# Patient Record
Sex: Female | Born: 2000 | Race: Black or African American | Hispanic: No | Smoking: Never smoker
Health system: Southern US, Community
[De-identification: ages and names within clinical notes are randomized; demographics above are authoritative.]

## PROBLEM LIST (undated history)

## (undated) DIAGNOSIS — F3181 Bipolar II disorder: Secondary | ICD-10-CM

## (undated) DIAGNOSIS — F329 Major depressive disorder, single episode, unspecified: Secondary | ICD-10-CM

---
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
## 2020-03-27 ENCOUNTER — Other Ambulatory Visit: Payer: Self-pay

## 2020-03-27 ENCOUNTER — Emergency Department (HOSPITAL_COMMUNITY)
Admission: EM | Admit: 2020-03-27 | Discharge: 2020-03-28 | Disposition: A | Discharge status: Other Acute Inpatient Hospital | Payer: BC Managed Care – PPO | Source: Home / Self Care | Attending: Emergency Medicine | Admitting: Emergency Medicine

## 2020-03-27 ENCOUNTER — Encounter (HOSPITAL_COMMUNITY): Payer: Self-pay | Admitting: Emergency Medicine

## 2020-03-27 DIAGNOSIS — Z7722 Contact with and (suspected) exposure to environmental tobacco smoke (acute) (chronic): Secondary | ICD-10-CM | POA: Diagnosis not present

## 2020-03-27 DIAGNOSIS — F4310 Post-traumatic stress disorder, unspecified: Secondary | ICD-10-CM | POA: Diagnosis not present

## 2020-03-27 DIAGNOSIS — R45851 Suicidal ideations: Secondary | ICD-10-CM

## 2020-03-27 DIAGNOSIS — Z818 Family history of other mental and behavioral disorders: Secondary | ICD-10-CM | POA: Diagnosis not present

## 2020-03-27 DIAGNOSIS — Z91013 Allergy to seafood: Secondary | ICD-10-CM | POA: Diagnosis not present

## 2020-03-27 DIAGNOSIS — F332 Major depressive disorder, recurrent severe without psychotic features: Secondary | ICD-10-CM | POA: Insufficient documentation

## 2020-03-27 DIAGNOSIS — Z880 Allergy status to penicillin: Secondary | ICD-10-CM | POA: Diagnosis not present

## 2020-03-27 DIAGNOSIS — Z91030 Bee allergy status: Secondary | ICD-10-CM | POA: Diagnosis not present

## 2020-03-27 DIAGNOSIS — Z79899 Other long term (current) drug therapy: Secondary | ICD-10-CM | POA: Diagnosis not present

## 2020-03-27 DIAGNOSIS — Z91018 Allergy to other foods: Secondary | ICD-10-CM | POA: Diagnosis not present

## 2020-03-27 DIAGNOSIS — Z03818 Encounter for observation for suspected exposure to other biological agents ruled out: Secondary | ICD-10-CM | POA: Diagnosis not present

## 2020-03-27 DIAGNOSIS — Z20822 Contact with and (suspected) exposure to covid-19: Secondary | ICD-10-CM | POA: Insufficient documentation

## 2020-03-27 LAB — CBC
HCT: 39.8 % (ref 36.0–46.0)
Hemoglobin: 12.8 g/dL (ref 12.0–15.0)
MCH: 27.9 pg (ref 26.0–34.0)
MCHC: 32.2 g/dL (ref 30.0–36.0)
MCV: 86.9 fL (ref 80.0–100.0)
Platelets: 394 10*3/uL (ref 150–400)
RBC: 4.58 MIL/uL (ref 3.87–5.11)
RDW: 14.6 % (ref 11.5–15.5)
WBC: 6.5 10*3/uL (ref 4.0–10.5)
nRBC: 0 % (ref 0.0–0.2)

## 2020-03-27 LAB — COMPREHENSIVE METABOLIC PANEL
ALT: 10 U/L (ref 0–44)
AST: 19 U/L (ref 15–41)
Albumin: 4.4 g/dL (ref 3.5–5.0)
Alkaline Phosphatase: 56 U/L (ref 38–126)
Anion gap: 12 (ref 5–15)
BUN: 11 mg/dL (ref 6–20)
CO2: 22 mmol/L (ref 22–32)
Calcium: 9.7 mg/dL (ref 8.9–10.3)
Chloride: 104 mmol/L (ref 98–111)
Creatinine, Ser: 1.08 mg/dL — ABNORMAL HIGH (ref 0.44–1.00)
GFR calc Af Amer: >60 mL/min (ref >60)
GFR calc non Af Amer: >60 mL/min (ref >60)
Glucose, Bld: 115 mg/dL — ABNORMAL HIGH (ref 70–99)
Potassium: 3.8 mmol/L (ref 3.5–5.1)
Sodium: 138 mmol/L (ref 135–145)
Total Bilirubin: 0.4 mg/dL (ref 0.3–1.2)
Total Protein: 7.7 g/dL (ref 6.5–8.1)

## 2020-03-27 LAB — I-STAT BETA HCG BLOOD, ED (MC, WL, AP ONLY): I-stat hCG, quantitative: <5 m[IU]/mL (ref <5)

## 2020-03-27 LAB — RAPID URINE DRUG SCREEN, HOSP PERFORMED
Amphetamines: NOT DETECTED
Barbiturates: NOT DETECTED
Benzodiazepines: NOT DETECTED
Cocaine: NOT DETECTED
Opiates: NOT DETECTED
Tetrahydrocannabinol: NOT DETECTED
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
## 2020-03-27 NOTE — ED Notes (Signed)
Pt oriented to take all her clothing out and change on scrubs. Staffing called for sitter case available at mid night. Pt sitting on a recliner chair on triage until be able to get to a room. Pt calm and cooperative at this time.
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
## 2020-03-28 ENCOUNTER — Encounter (HOSPITAL_COMMUNITY): Payer: Self-pay | Admitting: Nurse Practitioner

## 2020-03-28 ENCOUNTER — Inpatient Hospital Stay (HOSPITAL_COMMUNITY)
Admission: AD | Admit: 2020-03-28 | Discharge: 2020-03-31 | DRG: 885 | Disposition: A | Discharge status: Home / Self Care | Payer: BC Managed Care – PPO | Source: Intra-hospital | Attending: Psychiatry | Admitting: Psychiatry

## 2020-03-28 DIAGNOSIS — Z91013 Allergy to seafood: Secondary | ICD-10-CM

## 2020-03-28 DIAGNOSIS — Z880 Allergy status to penicillin: Secondary | ICD-10-CM

## 2020-03-28 DIAGNOSIS — Z20822 Contact with and (suspected) exposure to covid-19: Secondary | ICD-10-CM | POA: Diagnosis present

## 2020-03-28 DIAGNOSIS — F4310 Post-traumatic stress disorder, unspecified: Secondary | ICD-10-CM | POA: Diagnosis present

## 2020-03-28 DIAGNOSIS — R45851 Suicidal ideations: Secondary | ICD-10-CM | POA: Diagnosis present

## 2020-03-28 DIAGNOSIS — Z7722 Contact with and (suspected) exposure to environmental tobacco smoke (acute) (chronic): Secondary | ICD-10-CM | POA: Diagnosis present

## 2020-03-28 DIAGNOSIS — F332 Major depressive disorder, recurrent severe without psychotic features: Secondary | ICD-10-CM | POA: Diagnosis not present

## 2020-03-28 DIAGNOSIS — Z79899 Other long term (current) drug therapy: Secondary | ICD-10-CM

## 2020-03-28 DIAGNOSIS — Z91030 Bee allergy status: Secondary | ICD-10-CM | POA: Diagnosis not present

## 2020-03-28 DIAGNOSIS — Z818 Family history of other mental and behavioral disorders: Secondary | ICD-10-CM

## 2020-03-28 DIAGNOSIS — Z91018 Allergy to other foods: Secondary | ICD-10-CM

## 2020-03-28 LAB — ETHANOL: Alcohol, Ethyl (B): <10 mg/dL (ref <10)

## 2020-03-28 LAB — ACETAMINOPHEN LEVEL: Acetaminophen (Tylenol), Serum: <10 ug/mL — ABNORMAL LOW (ref 10–30)

## 2020-03-28 LAB — RESPIRATORY PANEL BY RT PCR (FLU A&B, COVID)
Influenza A by PCR: NEGATIVE
Influenza B by PCR: NEGATIVE
SARS Coronavirus 2 by RT PCR: NEGATIVE

## 2020-03-28 LAB — SALICYLATE LEVEL: Salicylate Lvl: <7 mg/dL — ABNORMAL LOW (ref 7.0–30.0)

## 2020-03-28 MED ORDER — ALUM & MAG HYDROXIDE-SIMETH 200-200-20 MG/5ML PO SUSP: 30.0000 mL | ORAL | Status: DC | PRN

## 2020-03-28 MED ORDER — HYDROXYZINE HCL 25 MG PO TABS: 25.0000 mg | ORAL_TABLET | Freq: Three times a day (TID) | ORAL | Status: DC | PRN

## 2020-03-28 MED ORDER — VENLAFAXINE HCL ER 37.5 MG PO CP24
37.5000 mg | ORAL_CAPSULE | Freq: Every day | ORAL | Status: DC
  Administered 2020-03-29: 08:00:00 37.5 mg via ORAL
  Filled 2020-03-28 (×2): qty 1

## 2020-03-28 MED ORDER — ACETAMINOPHEN 325 MG PO TABS
650.0000 mg | ORAL_TABLET | Freq: Four times a day (QID) | ORAL | Status: DC | PRN
  Administered 2020-03-29: 650 mg via ORAL
  Filled 2020-03-28: qty 2

## 2020-03-28 MED ORDER — MAGNESIUM HYDROXIDE 400 MG/5ML PO SUSP: 30.0000 mL | Freq: Every day | ORAL | Status: DC | PRN

## 2020-03-28 MED ORDER — NORGESTIM-ETH ESTRAD TRIPHASIC 0.18/0.215/0.25 MG-35 MCG PO TABS
1.0000 | ORAL_TABLET | Freq: Every day | ORAL | Status: DC
  Administered 2020-03-29 – 2020-03-30 (×2): 1 via ORAL

## 2020-03-28 MED ORDER — LORAZEPAM 0.5 MG PO TABS
0.5000 mg | ORAL_TABLET | Freq: Four times a day (QID) | ORAL | Status: DC | PRN
  Administered 2020-03-28 – 2020-03-30 (×3): 0.5 mg via ORAL
  Filled 2020-03-28 (×3): qty 1

## 2020-03-28 NOTE — BH Assessment (Signed)
Received TTS consult request. Pt is currently in hallway. TTS will assess Pt when she is in a room.   Pamalee Leyden, [facility], [facility] Triage Specialist [phone]
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
## 2020-03-28 NOTE — ED Notes (Addendum)
Pt voiced understanding and agreement w/tx plan - Accepted to [facility] - Signed consent form - Copy faxed to [facility] - Copy sent to Medical Records - Original placed in envelope for [facility]. Pt gave permission for RN to speak w/her mother Allie Dimmer) and father Lil Lepage). Offered for pt to call to notify them of tx plan - states will call them later.

## 2020-03-28 NOTE — ED Notes (Addendum)
Pt spoke w/her mother on phone - advised of tx plan - Accepted to [facility] - ALL belongings - 1 labeled belongings bag and 1 valuables envelope - Safe Transport - Pt aware. Pt changing into blue paper scrubs.

## 2020-03-28 NOTE — ED Notes (Signed)
BHH after 0730   bfast ordered
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
## 2020-03-28 NOTE — Progress Notes (Signed)
   03/28/20 2158  Psych Admission Type (Psych Patients Only)  Admission Status Voluntary  Psychosocial Assessment  Patient Complaints Anxiety  Eye Contact Fair  Facial Expression Flat;Anxious  Affect Appropriate to circumstance  Speech Logical/coherent  Interaction Assertive  Motor Activity Other (Comment) (WDL)  Appearance/Hygiene Unremarkable  Behavior Characteristics Appropriate to situation  Mood Anxious;Pleasant  Thought Process  Coherency WDL  Content WDL  Delusions None reported or observed  Perception WDL  Hallucination None reported or observed  Judgment Impaired  Confusion None  Danger to Self  Current suicidal ideation? Denies  Danger to Others  Danger to Others None reported or observed

## 2020-03-28 NOTE — ED Notes (Signed)
TTS in process 

## 2020-03-28 NOTE — Progress Notes (Signed)
   03/28/20 2156  COVID-19 Daily Checkoff  Have you had a fever (temp > 37.80C/100F)  in the past 24 hours?  No  If you have had runny nose, nasal congestion, sneezing in the past 24 hours, has it worsened? No  COVID-19 EXPOSURE  Have you traveled outside the state in the past 14 days? No  Have you been in contact with someone with a confirmed diagnosis of COVID-19 or PUI in the past 14 days without wearing appropriate PPE? No  Have you been living in the same home as a person with confirmed diagnosis of COVID-19 or a PUI (household contact)? No  Have you been diagnosed with COVID-19? No

## 2020-03-28 NOTE — ED Notes (Signed)
Pt noted to be sleeping - pt declined to eat breakfast.
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
## 2020-03-28 NOTE — Progress Notes (Signed)
[facility] Group Notes:  (Nursing/MHT/Case Management/Adjunct)  Date:  03/28/2020  Time:  2030  Type of Therapy:  wrap up group  Participation Level:  Active  Participation Quality:  Appropriate, Attentive, Sharing and Supportive  Affect:  Anxious  Cognitive:  Appropriate  Insight:  Improving  Engagement in Group:  Engaged  Modes of Intervention:  Clarification, Education and Support  Summary of Progress/Problems: Positive thinking and self-care were discussed.   Marcille Buffy 03/28/2020, 9:32 PM

## 2020-03-28 NOTE — Progress Notes (Signed)
Patient ID: Krista Schmidt, female   DOB: 2000/12/25, 19 y.o.   MRN: [id number]  Pt alert and oriented during [facility] admission. Pt denies SI/HI, A/VH, and any pain. Education, support, reassurance, and encouragement provided, q15 minute safety checks initiated. Pt's belongings in locker #29. Pt verbally contracts for safety. Pt ambulating on the unit with no issues. Pt remains safe on the unit.

## 2020-03-28 NOTE — BH Assessment (Signed)
Tele Assessment Note   Patient Name: Krista Schmidt MRN: [id number] Referring Physician: Sharilyn Sites, PA-C Location of Patient: [facility], [phone] Location of Provider: Behavioral Health TTS Department  Krista Schmidt is an 19 y.o. single female who presents unaccompanied to [facility] reporting symptoms of depression including suicidal ideation. She says she came to ED because she fears she will act on suicidal thoughts. Pt denies a current plan but states she overdosed on Tylenol in a suicide attempt two years ago and never sought treatment. Pt reports she has felt increasingly depressed for the past several months. She describes her mood as "shaky" and "off" and says she has mood swings from sadness to anger. She acknowledges symptoms including crying spells, social withdrawal, loss of interest in usual pleasures, fatigue, irritability, decreased concentration, increased sleep, staying in bed, decreased appetite and feelings of guilt, worthlessness and hopelessness. Pt denies any history of intentional self-injurious behaviors. Pt denies current homicidal ideation or history of violence. Pt denies any history of auditory or visual hallucinations. Pt denies history of alcohol or other substance use.  Pt identifies several stressors. She says her best friend died in a motor vehicle accident one month ago. Pt states she and deceased friend's sister actually got quite close but they are no longer on speaking terms now either. She says due to her depressive symptoms she stopped going to college classes last semester and dropped out of school. She says she is unemployed and has been too unmotivated to find employment. Pt says she used to be an ambitious, motivated person. Pt says she lives with her mother and they have a conflictual relationship. Pt states her mother has always worked and from age 58 Pt was left at home alone and was forced to assume responsibilities. She says when Pt is unhappy her  mother reacts with anger, so she learned to keep her emotions and problems to herself. Pt says she is not close to her father. She says there is a maternal and paternal history of depression and anxiety. Pt says her parents are from [place] and there is a cultural bias against mental health treatment. She says her boyfriend and another friend, Bryson Corona, are her primary support. Pt denies legal problems. She denies access to firearms.  Pt reports she is receiving outpatient therapy and medication management through [facility]. She says she has not seen her therapist in three months. She says she is prescribed Zoloft and hydroxyzine. She says she recently stopped taking medications because Zoloft makes her feel "numb" and hydroxyzine makes her feel "tired." Pt denies any history of inpatient psychiatric treatment.  Pt does not identify anyone to contact for collateral information.  Pt is dressed in hospital scrubs, alert and oriented x4. Pt speaks in a clear tone, at moderate volume and normal pace. Motor behavior appears normal. Eye contact is good. Pt's mood is depressed and affect is congruent with mood. Thought process is coherent and relevant. There is no indication Pt is currently responding to internal stimuli or experiencing delusional thought content. Pt was pleasant and cooperative throughout assessment. She states she is willing to sign voluntarily into a psychiatric facility.    Diagnosis: F33.2 Major depressive disorder, Recurrent episode, Severe  Past Medical History: History reviewed. No pertinent past medical history.  Past Surgical History:  Procedure Laterality Date  . WISDOM TOOTH EXTRACTION  07/2017    Family History:  Family History  Problem Relation Age of Onset  . Depression Mother   .  Cancer Maternal Aunt   . Hypertension Maternal Uncle   . Asthma Maternal Grandmother   . Cancer Maternal Grandfather   . Diabetes Paternal Grandmother     Social History:   reports that she is a non-smoker but has been exposed to tobacco smoke. She has never used smokeless tobacco. She reports that she does not drink alcohol or use drugs.  Additional Social History:  Alcohol / Drug Use Pain Medications: Denies abuse Prescriptions: Denies abuse Over the Counter: Denies abuse History of alcohol / drug use?: No history of alcohol / drug abuse Longest period of sobriety (when/how long): NA  CIWA: CIWA-Ar BP: 130/84 Pulse Rate: 89 COWS:    Allergies:  Allergies  Allergen Reactions  . Banana Itching and Other (See Comments)    Throat and tongue itch   . Pollen Extract Shortness Of Breath, Itching and Cough    Close to anaphylaxis and this causes wheezing  . Honey Bee Treatment [Bee Venom] Other (See Comments)    Headaches   . Shellfish-Derived Products Diarrhea, Itching and Other (See Comments)    Throat itches and gums "burn" also  . Amoxicillin Nausea And Vomiting  . Latex Rash    Home Medications: (Not in a hospital admission)   OB/GYN Status:  No LMP recorded.  General Assessment Data Assessment unable to be completed: Yes Reason for not completing assessment: Pt in hallway Location of Assessment: [facility] ED TTS Assessment: In system Is this a Tele or Face-to-Face Assessment?: Tele Assessment Is this an Initial Assessment or a Re-assessment for this encounter?: Initial Assessment Patient Accompanied by:: N/A Language Other than English: No Living Arrangements: Other (Comment)(Lives with mother) What gender do you identify as?: Female Marital status: Single Maiden name: Canby Pregnancy Status: No Living Arrangements: Parent Can pt return to current living arrangement?: Yes Admission Status: Voluntary Is patient capable of signing voluntary admission?: Yes Referral Source: Self/Family/Friend Insurance type: [place] Living Arrangements: Parent Legal Guardian: Other:(Self) Name of Psychiatrist: [place] Name of Therapist: [place]  Education Status Is patient currently in school?: No Is the patient employed, unemployed or receiving disability?: Unemployed  Risk to self with the past 6 months Suicidal Ideation: Yes-Currently Present Has patient been a risk to self within the past 6 months prior to admission? : Yes Suicidal Intent: No Has patient had any suicidal intent within the past 6 months prior to admission? : No Is patient at risk for suicide?: Yes Suicidal Plan?: No Has patient had any suicidal plan within the past 6 months prior to admission? : No Access to Means: No What has been your use of drugs/alcohol within the last 12 months?: Pt denies Previous Attempts/Gestures: Yes How many times?: 1(Overdose on Tylenol 2 years ago) Other Self Harm Risks: None Triggers for Past Attempts: Other (Comment)("Feeling lonely") Intentional Self Injurious Behavior: None Family Suicide History: No Recent stressful life event(s): Loss (Comment), Conflict (Comment), Other (Comment)(Friend died) Persecutory voices/beliefs?: No Depression: Yes Depression Symptoms: Despondent, Tearfulness, Isolating, Fatigue, Guilt, Loss of interest in usual pleasures, Feeling worthless/self pity, Feeling angry/irritable Substance abuse history and/or treatment for substance abuse?: No Suicide prevention information given to non-admitted patients: Not applicable  Risk to Others within the past 6 months Homicidal Ideation: No Does patient have any lifetime risk of violence toward others beyond the six months prior to admission? : No Thoughts of Harm to Others: No Current Homicidal Intent: No Current Homicidal Plan: No Access to Homicidal  Means: No Identified Victim: None History of harm to others?: No Assessment of Violence: None Noted Violent Behavior Description: Pt denies Does patient have access to weapons?: No Criminal Charges Pending?: No Does patient have a court date: No Is  patient on probation?: No  Psychosis Hallucinations: None noted Delusions: None noted  Mental Status Report Appearance/Hygiene: In scrubs Eye Contact: Good Motor Activity: Freedom of movement, Unremarkable Speech: Logical/coherent Level of Consciousness: Alert Mood: Depressed Affect: Depressed Anxiety Level: Minimal Thought Processes: Coherent, Relevant Judgement: Partial Orientation: Person, Place, Time, Situation Obsessive Compulsive Thoughts/Behaviors: None  Cognitive Functioning Concentration: Normal Memory: Recent Intact, Remote Intact Is patient IDD: No Insight: Good Impulse Control: Good Appetite: Poor Have you had any weight changes? : No Change Sleep: Increased Total Hours of Sleep: 10 Vegetative Symptoms: None  ADLScreening [facility] Assessment Services) Patient's cognitive ability adequate to safely complete daily activities?: Yes Patient able to express need for assistance with ADLs?: Yes Independently performs ADLs?: Yes (appropriate for developmental age)  Prior Inpatient Therapy Prior Inpatient Therapy: No  Prior Outpatient Therapy Prior Outpatient Therapy: Yes Prior Therapy Dates: Current Prior Therapy Facilty/Provider(s): [facility] Reason for Treatment: Depression Does patient have an ACCT team?: No Does patient have Intensive In-House Services?  : No Does patient have [facility] services? : No Does patient have P4CC services?: No  ADL Screening (condition at time of admission) Patient's cognitive ability adequate to safely complete daily activities?: Yes Is the patient deaf or have difficulty hearing?: No Does the patient have difficulty seeing, even when wearing glasses/contacts?: No Does the patient have difficulty concentrating, remembering, or making decisions?: No Patient able to express need for assistance with ADLs?: Yes Does the patient have difficulty dressing or bathing?: No Independently performs ADLs?: Yes (appropriate for  developmental age) Does the patient have difficulty walking or climbing stairs?: No Weakness of Legs: None Weakness of Arms/Hands: None  Home Assistive Devices/Equipment Home Assistive Devices/Equipment: None    Abuse/Neglect Assessment (Assessment to be complete while patient is alone) Abuse/Neglect Assessment Can Be Completed: Yes Physical Abuse: Denies Verbal Abuse: Denies Sexual Abuse: Denies Exploitation of patient/patient's resources: Denies Self-Neglect: Denies     Merchant navy officer (For Healthcare) Does Patient Have a Medical Advance Directive?: No Would patient like information on creating a medical advance directive?: No - Patient declined          Disposition: Gave clinical report to Nira Conn, FNP who determined Pt meets criteria for inpatient psychiatric treatment. Leeann Must at [facility], confirmed bed availability after 0730. Pt accepted to the service of Dr. Carmon Ginsberg. [place], room 306-1. Notified Sharilyn Sites, PA-C and Simeon Craft, RN of recommendation.   Disposition Initial Assessment Completed for this Encounter: Yes  This service was provided via telemedicine using a 2-way, interactive audio and video technology.  Names of all persons participating in this telemedicine service and their role in this encounter. Name: Jerilynn Birkenhead Role: Patient  Name: Shela Commons, [facility] Role: TTS counselor         Harlin Rain Patsy [place], [facility], [facility] Triage Specialist [phone]  Pamalee Leyden 03/28/2020 2:53 AM

## 2020-03-29 LAB — HEMOGLOBIN A1C
Hgb A1c MFr Bld: 5.8 % — ABNORMAL HIGH (ref 4.8–5.6)
Mean Plasma Glucose: 119.76 mg/dL

## 2020-03-29 LAB — LIPID PANEL
Cholesterol: 274 mg/dL — ABNORMAL HIGH (ref 0–169)
HDL: 99 mg/dL (ref >40)
LDL Cholesterol: 157 mg/dL — ABNORMAL HIGH (ref 0–99)
Total CHOL/HDL Ratio: 2.8 RATIO
Triglycerides: 92 mg/dL (ref <150)
VLDL: 18 mg/dL (ref 0–40)

## 2020-03-29 LAB — TSH: TSH: 2.18 u[IU]/mL (ref 0.350–4.500)

## 2020-03-29 MED ORDER — VENLAFAXINE HCL ER 75 MG PO CP24
75.0000 mg | ORAL_CAPSULE | Freq: Every day | ORAL | Status: DC
  Administered 2020-03-30 – 2020-03-31 (×2): 75 mg via ORAL
  Filled 2020-03-29 (×4): qty 1

## 2020-03-29 NOTE — BHH Group Notes (Signed)
LCSW Group Therapy Note 03/29/2020 2:13 PM  Type of Therapy and Topic: Group Therapy: Overcoming Obstacles  Participation Level: Active  Description of Group:  In this group patients will be encouraged to explore what they see as obstacles to their own wellness and recovery. They will be guided to discuss their thoughts, feelings, and behaviors related to these obstacles. The group will process together ways to cope with barriers, with attention given to specific choices patients can make. Each patient will be challenged to identify changes they are motivated to make in order to overcome their obstacles. This group will be process-oriented, with patients participating in exploration of their own experiences as well as giving and receiving support and challenge from other group members.  Therapeutic Goals: 1. Patient will identify personal and current obstacles as they relate to admission. 2. Patient will identify barriers that currently interfere with their wellness or overcoming obstacles.  3. Patient will identify feelings, thought process and behaviors related to these barriers. 4. Patient will identify two changes they are willing to make to overcome these obstacles:   Summary of Patient Progress  Krista Schmidt was engaged and participated throughout the group session. Krista Schmidt reports her main obstacle is her strained relationship with her mother. She reports her mother is not "as supportive" ash she needs to be. Krista Schmidt shared with the group that her mother and family members do not believe in "mental illness/health". She shared that she has expressed concerns regarding her depression and mental health issues, however her mother dismisses her feelings and states "it is all in your head" or "you just want some attention".   Krista Schmidt states due to her families' cultural background being rooted in [place] culture, she believes it will "extremely hard" to convince mother that her mental health needs  "are real and need to be taken seriously". Krista Schmidt states that she plans to have a conversation with her mother, however if her mother continues to be unsupportive then she will have to learn how to move forward without that support.     Therapeutic Modalities:  Cognitive Behavioral Therapy Solution Focused Therapy Motivational Interviewing Relapse Prevention Therapy   Alcario Drought Clinical Social Worker
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
## 2020-03-29 NOTE — Tx Team (Signed)
Interdisciplinary Treatment and Diagnostic Plan Update  03/29/2020 Time of Session: 9:30am Krista Schmidt MRN: [id number]  Principal Diagnosis: <principal problem not specified>  Secondary Diagnoses: Active Problems:   Severe recurrent major depression without psychotic features ([facility])   Current Medications:  Current Facility-Administered Medications  Medication Dose Route Frequency Provider Last Rate Last Admin  . acetaminophen (TYLENOL) tablet 650 mg  650 mg Oral Q6H PRN Lindon Romp A, NP      . alum & mag hydroxide-simeth (MAALOX/MYLANTA) 200-200-20 MG/5ML suspension 30 mL  30 mL Oral Q4H PRN Lindon Romp A, NP      . LORazepam (ATIVAN) tablet 0.5 mg  0.5 mg Oral Q6H PRN Cobos, Myer Peer, MD   0.5 mg at 03/28/20 2150  . magnesium hydroxide (MILK OF MAGNESIA) suspension 30 mL  30 mL Oral Daily PRN Rozetta Nunnery, NP      . Norgestimate-Ethinyl Estradiol Triphasic 0.18/0.215/0.25 MG-35 MCG tablet 1 tablet  1 tablet Oral Daily Rozetta Nunnery, NP      . Derrill Memo ON 03/30/2020] venlafaxine XR (EFFEXOR-XR) 24 hr capsule 75 mg  75 mg Oral Q breakfast Cobos, Myer Peer, MD       PTA Medications: Medications Prior to Admission  Medication Sig Dispense Refill Last Dose  . hydrOXYzine (ATARAX/VISTARIL) 25 MG tablet Take 25 mg by mouth daily.     . sertraline (ZOLOFT) 100 MG tablet Take 1.5 mg by mouth daily.     . TRI-SPRINTEC 0.18/0.215/0.25 MG-35 MCG tablet Take 1 tablet by mouth daily.        Patient Stressors: Educational concerns Loss of friend to death Occupational concerns Traumatic event  Patient Strengths: Average or above average intelligence Supportive family/friends  Treatment Modalities: Medication Management, Group therapy, Case management,  1 to 1 session with clinician, Psychoeducation, Recreational therapy.   Physician Treatment Plan for Primary Diagnosis: <principal problem not specified> Long Term Goal(s): Improvement in symptoms so as ready for discharge Improvement  in symptoms so as ready for discharge   Short Term Goals: Ability to identify changes in lifestyle to reduce recurrence of condition will improve Ability to verbalize feelings will improve Ability to disclose and discuss suicidal ideas Ability to demonstrate self-control will improve Ability to identify and develop effective coping behaviors will improve Ability to maintain clinical measurements within normal limits will improve Compliance with prescribed medications will improve Ability to identify changes in lifestyle to reduce recurrence of condition will improve Ability to verbalize feelings will improve Ability to disclose and discuss suicidal ideas Ability to demonstrate self-control will improve Ability to identify and develop effective coping behaviors will improve Ability to maintain clinical measurements within normal limits will improve Compliance with prescribed medications will improve  Medication Management: Evaluate patient's response, side effects, and tolerance of medication regimen.  Therapeutic Interventions: 1 to 1 sessions, Unit Group sessions and Medication administration.  Evaluation of Outcomes: Not Met  Physician Treatment Plan for Secondary Diagnosis: Active Problems:   Severe recurrent major depression without psychotic features ([place])  Long Term Goal(s): Improvement in symptoms so as ready for discharge Improvement in symptoms so as ready for discharge   Short Term Goals: Ability to identify changes in lifestyle to reduce recurrence of condition will improve Ability to verbalize feelings will improve Ability to disclose and discuss suicidal ideas Ability to demonstrate self-control will improve Ability to identify and develop effective coping behaviors will improve Ability to maintain clinical measurements within normal limits will improve Compliance with prescribed medications will improve Ability to  identify changes in lifestyle to reduce recurrence of  condition will improve Ability to verbalize feelings will improve Ability to disclose and discuss suicidal ideas Ability to demonstrate self-control will improve Ability to identify and develop effective coping behaviors will improve Ability to maintain clinical measurements within normal limits will improve Compliance with prescribed medications will improve     Medication Management: Evaluate patient's response, side effects, and tolerance of medication regimen.  Therapeutic Interventions: 1 to 1 sessions, Unit Group sessions and Medication administration.  Evaluation of Outcomes: Not Met   RN Treatment Plan for Primary Diagnosis: <principal problem not specified> Long Term Goal(s): Knowledge of disease and therapeutic regimen to maintain health will improve  Short Term Goals: Ability to verbalize feelings will improve, Ability to disclose and discuss suicidal ideas, Ability to identify and develop effective coping behaviors will improve and Compliance with prescribed medications will improve  Medication Management: RN will administer medications as ordered by provider, will assess and evaluate patient's response and provide education to patient for prescribed medication. RN will report any adverse and/or side effects to prescribing provider.  Therapeutic Interventions: 1 on 1 counseling sessions, Psychoeducation, Medication administration, Evaluate responses to treatment, Monitor vital signs and CBGs as ordered, Perform/monitor CIWA, COWS, AIMS and Fall Risk screenings as ordered, Perform wound care treatments as ordered.  Evaluation of Outcomes: Not Met   LCSW Treatment Plan for Primary Diagnosis: <principal problem not specified> Long Term Goal(s): Safe transition to appropriate next level of care at discharge, Engage patient in therapeutic group addressing interpersonal concerns.  Short Term Goals: Engage patient in aftercare planning with referrals and resources  Therapeutic  Interventions: Assess for all discharge needs, 1 to 1 time with Social worker, Explore available resources and support systems, Assess for adequacy in community support network, Educate family and significant other(s) on suicide prevention, Complete Psychosocial Assessment, Interpersonal group therapy.  Evaluation of Outcomes: Not Met   Progress in Treatment: Attending groups: No. New to unit  Participating in groups: No. Taking medication as prescribed: Yes. Toleration medication: Yes. Family/Significant other contact made: No, will contact:  the patient's mother Patient understands diagnosis: Yes. Discussing patient identified problems/goals with staff: Yes. Medical problems stabilized or resolved: Yes. Denies suicidal/homicidal ideation: Yes. Issues/concerns per patient self-inventory: No. Other:   New problem(s) identified: None   New Short Term/Long Term Goal(s):medication stabilization, elimination of SI thoughts, development of comprehensive mental wellness plan.    Patient Goals: "I just want to get my medications straight so I can go home"    Discharge Plan or Barriers: Patient recently admitted. CSW will continue to follow and assess for appropriate referrals and possible discharge planning.    Reason for Continuation of Hospitalization: Anxiety Depression Medication stabilization Other; describe Grief, Insomnia  Estimated Length of Stay: 3-5 days   Attendees: Patient: Krista Schmidt  03/29/2020 10:20 AM  Physician: Dr. Neita Garnet, MD 03/29/2020 10:20 AM  Nursing:  03/29/2020 10:20 AM  RN Care Manager: 03/29/2020 10:20 AM  Social Worker: Radonna Ricker, LCSW 03/29/2020 10:20 AM  Recreational Therapist:  03/29/2020 10:20 AM  Other:  03/29/2020 10:20 AM  Other:  03/29/2020 10:20 AM  Other: 03/29/2020 10:20 AM    Scribe for Treatment Team: Marylee Floras, [place] 03/29/2020 10:20 AM

## 2020-03-29 NOTE — Plan of Care (Signed)
Patient stayed in the dayroom with peers until bedtime. Alert and oriented. Calm and Cooperative. Attended group and had a snack. Presented to the medication window reporting that she is feeling some improvement. Denied thoughts of self harm. Had a long conversation with this Clinical research associate, discussing about her plans for the future. "I want to go to college, but I want to get myself together first.... patient was pleasant  during the conversation.

## 2020-03-29 NOTE — Progress Notes (Signed)
Recreation Therapy Notes  Date:  5.3.21 Time: 0930 Location: 300 Hall Dayroom  Group Topic: Stress Management  Goal Area(s) Addresses:  Patient will identify positive stress management techniques. Patient will identify benefits of using stress management post d/c.  Intervention: Stress Management  Activity: Guided Imagery.  LRT read a script that took patients on a journey to the beach to hear the peaceful waves.  Patients were to listen and follow along as script was read to engage in activity.    Education:  Stress Management, Discharge Planning.   Education Outcome: Acknowledges Education  Clinical Observations/Feedback: Pt did not attend activity.    Jazmine Longshore, LRT/CTRS         Aury Scollard A 03/29/2020 12:07 PM 

## 2020-03-29 NOTE — Progress Notes (Signed)
   03/29/20 1700  Psych Admission Type (Psych Patients Only)  Admission Status Voluntary  Psychosocial Assessment  Patient Complaints Anxiety  Eye Contact Fair  Facial Expression Flat;Anxious  Affect Appropriate to circumstance  Speech Logical/coherent  Interaction Assertive  Motor Activity Other (Comment) (WDL)  Appearance/Hygiene Unremarkable  Thought Process  Coherency WDL  Content WDL  Delusions None reported or observed  Perception WDL  Hallucination None reported or observed  Judgment Impaired  Confusion None  Danger to Self  Current suicidal ideation? Denies  Danger to Others  Danger to Others None reported or observed

## 2020-03-30 MED ORDER — WHITE PETROLATUM EX OINT
TOPICAL_OINTMENT | CUTANEOUS | Status: AC
  Filled 2020-03-30: qty 5

## 2020-03-30 NOTE — Progress Notes (Signed)
Recreation Therapy Notes  Animal-Assisted Activity (AAA) Program Checklist/Progress Notes Patient Eligibility Criteria Checklist & Daily Group note for Rec Tx Intervention  Date: 5.4.21 Time: 1430 Location: 300 Programmer, applications   AAA/T Program Assumption of Risk Form signed by Engineer, production or Parent Legal Guardian  YES  Patient is free of allergies or sever asthma  YES   Patient reports no fear of animals  YES   Patient reports no history of cruelty to animals  YES   Patient understands his/her participation is voluntary YES   Patient washes hands before animal contact  YES  Patient washes hands after animal contact  YES   Behavioral Response: Engaged  Education: Charity fundraiser, Appropriate Animal Interaction   Education Outcome: Acknowledges understanding/In group clarification offered/Needs additional education.   Clinical Observations/Feedback:  Pt attended and participated in group activity.     Caroll Rancher, LRT/CTRS         Caroll Rancher A 03/30/2020 3:16 PM

## 2020-03-30 NOTE — Plan of Care (Signed)
Nurse discussed anxiety, depression and coping skills with patient.  
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
## 2020-03-30 NOTE — Progress Notes (Signed)
D:  Patient's self inventory sheet, patient has fair sleep, sleep medication given.  Poor appetite, .low energy level, poor concentration.  Rated depression 6, hopeless 5, anxiety 4.  Denied withdrawals, then checked diarrhea, cramping.  Physical problems, pain, dizziness, hearing.   Neck pain, worst pain is #5  in past 24 hours.  Wants to insure that her medicine is situated so she can be discharged tomorrow.  Plans to talk to MD.  No discharge plans at this time. A:  Medications administered per MD orders.  Emotional support and encouragement given patient. R:  Denied SI and HI, contracts for safety.  Denied A/V hallucinations.  Safety maintained with 15 minute checks.

## 2020-03-30 NOTE — Progress Notes (Signed)
The patient states that what she learned today is how different drugs affect your body. Her goal for tomorrow is to get discharged.

## 2020-03-30 NOTE — Progress Notes (Signed)
   03/30/20 2300  Psych Admission Type (Psych Patients Only)  Admission Status Voluntary  Psychosocial Assessment  Patient Complaints Anxiety;Depression  Eye Contact Fair  Facial Expression Flat  Affect Appropriate to circumstance  Speech Logical/coherent  Interaction Other (Comment) (appropriate)  Motor Activity Other (Comment) (WNL)  Appearance/Hygiene Unremarkable  Behavior Characteristics Anxious  Mood Depressed;Anxious  Thought Process  Coherency WDL  Content WDL  Delusions WDL;None reported or observed  Perception WDL  Hallucination None reported or observed  Judgment Poor  Confusion None  Danger to Self  Current suicidal ideation? Denies  Danger to Others  Danger to Others None reported or observed

## 2020-03-30 NOTE — Progress Notes (Signed)
Patient slept throughout the night. Had no sign of distress.

## 2020-03-31 MED ORDER — HYDROXYZINE HCL 25 MG PO TABS: 25.0000 mg | ORAL_TABLET | Freq: Every day | ORAL | 1 refills | Status: AC

## 2020-03-31 MED ORDER — VENLAFAXINE HCL ER 75 MG PO CP24: 75.0000 mg | ORAL_CAPSULE | Freq: Every day | ORAL | 1 refills | Status: DC

## 2020-03-31 NOTE — Progress Notes (Signed)
  [facility] Adult Case Management Discharge Plan :  Will you be returning to the same living situation after discharge:  Yes,  patient is returning home with her mother At discharge, do you have transportation home?: Yes,  boyfriend is picking her up Do you have the ability to pay for your medications: Yes,  BCBS  Release of information consent forms completed and in the chart;  Patient's signature needed at discharge.  Patient to Follow up at: Follow-up Information    [facility]. Go on 04/07/2020.   Specialty: Behavioral Health Why: You are scheduled for an appointment with therapist Shaniece on 04/07/20 at 10:00 am.  This appointment will be held in person. Contact information: [address] [place] [address] [place] [address] [phone]        [facility] Follow up on 04/01/2020.   Why: You have an appointment on 04/01/20 at 2:00 pm.  This will be a Virtual appointment.  Please have your insurance information and your discharge summary available.  Contact information: [address] [place] [address] [phone]           Next level of care provider has access to [facility] Link:yes  Safety Planning and Suicide Prevention discussed: Yes,  with the patient     Has patient been referred to the Quitline?: N/A patient is not a smoker  Patient has been referred for addiction treatment: N/A  Maeola Sarah, LCSWA 03/31/2020, 11:00 AM

## 2020-03-31 NOTE — Progress Notes (Signed)
Recreation Therapy Notes  Date:  5.5.21 Time: 0930 Location: 300 Hall Group Room  Group Topic: Stress Management  Goal Area(s) Addresses:  Patient will identify positive stress management techniques. Patient will identify benefits of using stress management post d/c.  Intervention: Stress Management  Activity :  Progressive Muscle Relaxation.  LRT read a script that guided patients in tensing and relaxing each muscle group individually.  Patients were to listen and follow along as script was read to engage in activity.  Education:  Stress Management, Discharge Planning.   Education Outcome: Acknowledges Education  Clinical Observations/Feedback: Pt did not attend activity.    Tianna Baus, LRT/CTRS         Alfonsa Vaile A 03/31/2020 12:04 PM 

## 2020-03-31 NOTE — [facility] Suicide Risk Assessment (Signed)
[facility] INPATIENT:  Family/Significant Other Suicide Prevention Education  Suicide Prevention Education:  Patient Refusal for Family/Significant Other Suicide Prevention Education: The patient Krista Schmidt has refused to provide written consent for family/significant other to be provided Family/Significant Other Suicide Prevention Education during admission and/or prior to discharge.  Physician notified.  SPE completed with patient, as patient refused to consent to family contact. SPI pamphlet provided to pt and pt was encouraged to share information with support network, ask questions, and talk about any concerns relating to SPE. Patient denies access to guns/firearms and verbalized understanding of information provided. Mobile Crisis information also provided to patient.    Maeola Sarah 03/31/2020, 10:59 AM

## 2020-03-31 NOTE — Progress Notes (Signed)
Discharge Note:   Pt discharged at 12:00 left with her ride, her boyfriend. Pt given discharge instructions, including follow up outpatient mental health appointments, and discharge prescriptions sent to pharmacy, also given discharge medication education; pt verbalized understanding of all. Upon discharge, pt is calm, cooperative, pleasant, denies suicidal and homicidal ideation, denies hallucinations, denies feelings of depression and anxiety. All personal belongings returned to pt upon discharge. Pt was cooperative with the discharge process, voiced no complaints.

## 2020-03-31 NOTE — [facility] Suicide Risk Assessment (Signed)
[facility] Discharge Suicide Risk Assessment   Principal Problem: <principal problem not specified> Discharge Diagnoses: Active Problems:   Severe recurrent major depression without psychotic features (HCC)   Total Time spent with patient: 20 minutes  Musculoskeletal: Strength & Muscle Tone: within normal limits Gait & Station: normal Patient leans: N/A  Psychiatric Specialty Exam: Review of Systems  All other systems reviewed and are negative.   Blood pressure 110/75, pulse 78, temperature 98 F (36.7 C), temperature source Oral, resp. rate 18, height 5\' 4"  (1.626 m), weight 64.9 kg, SpO2 100 %.Body mass index is 24.55 kg/m.  General Appearance: Casual  Eye Contact::  Good  Speech:  Normal Rate409  Volume:  Normal  Mood:  Euthymic  Affect:  Congruent  Thought Process:  Coherent and Descriptions of Associations: Intact  Orientation:  Full (Time, Place, and Person)  Thought Content:  Logical  Suicidal Thoughts:  No  Homicidal Thoughts:  No  Memory:  Immediate;   Fair Recent;   Fair Remote;   Fair  Judgement:  Intact  Insight:  Fair  Psychomotor Activity:  Normal  Concentration:  Good  Recall:  Good  Fund of Knowledge:Good  Language: Good  Akathisia:  Negative  Handed:  Right  AIMS (if indicated):     Assets:  Communication Skills Desire for Improvement Housing Physical Health Social Support  Sleep:  Number of Hours: 5.5  Cognition: WNL  ADL's:  Intact   Mental Status Per Nursing Assessment::   On Admission:  Self-harm thoughts  Demographic Factors:  Adolescent or young adult  Loss Factors: NA  Historical Factors: Impulsivity  Risk Reduction Factors:   Living with another person, especially a relative  Continued Clinical Symptoms:  Depression:   Impulsivity  Cognitive Features That Contribute To Risk:  None    Suicide Risk:  Minimal: No identifiable suicidal ideation.  Patients presenting with no risk factors but with morbid ruminations; may be  classified as minimal risk based on the severity of the depressive symptoms  Follow-up Information    [facility] Go on 04/07/2020.   Specialty: Behavioral Health Why: You are scheduled for an appointment with therapist Shaniece on 04/07/20 at 10:00 am.  This appointment will be held in person. Contact information: [address] [place] [address] [place] [phone]        [facility] Follow up on 04/01/2020.   Why: You have an appointment on 04/01/20 at 2:00 pm.  This will be a Virtual appointment.  Please have your insurance information and your discharge summary available.  Contact information: [address] [place] [phone]           Plan Of Care/Follow-up recommendations:  Activity:  ad lib  [phone], MD 03/31/2020, 8:01 AM
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
## 2020-04-09 DIAGNOSIS — F331 Major depressive disorder, recurrent, moderate: Secondary | ICD-10-CM | POA: Diagnosis not present

## 2020-05-05 DIAGNOSIS — F331 Major depressive disorder, recurrent, moderate: Secondary | ICD-10-CM | POA: Diagnosis not present

## 2020-06-18 DIAGNOSIS — R05 Cough: Secondary | ICD-10-CM | POA: Diagnosis not present

## 2020-06-18 DIAGNOSIS — U071 COVID-19: Secondary | ICD-10-CM | POA: Diagnosis not present

## 2020-06-18 DIAGNOSIS — R0981 Nasal congestion: Secondary | ICD-10-CM | POA: Diagnosis not present

## 2020-06-18 DIAGNOSIS — R509 Fever, unspecified: Secondary | ICD-10-CM | POA: Diagnosis not present

## 2020-08-16 DIAGNOSIS — Z113 Encounter for screening for infections with a predominantly sexual mode of transmission: Secondary | ICD-10-CM | POA: Diagnosis not present

## 2020-08-16 DIAGNOSIS — Z6825 Body mass index (BMI) 25.0-25.9, adult: Secondary | ICD-10-CM | POA: Diagnosis not present

## 2020-08-16 DIAGNOSIS — Z01419 Encounter for gynecological examination (general) (routine) without abnormal findings: Secondary | ICD-10-CM | POA: Diagnosis not present

## 2020-09-16 DIAGNOSIS — A749 Chlamydial infection, unspecified: Secondary | ICD-10-CM | POA: Diagnosis not present

## 2020-09-16 DIAGNOSIS — Z113 Encounter for screening for infections with a predominantly sexual mode of transmission: Secondary | ICD-10-CM | POA: Diagnosis not present

## 2020-09-20 DIAGNOSIS — Z7182 Exercise counseling: Secondary | ICD-10-CM | POA: Diagnosis not present

## 2020-09-20 DIAGNOSIS — Z713 Dietary counseling and surveillance: Secondary | ICD-10-CM | POA: Diagnosis not present

## 2020-09-20 DIAGNOSIS — Z23 Encounter for immunization: Secondary | ICD-10-CM | POA: Diagnosis not present

## 2020-09-20 DIAGNOSIS — Z0000 Encounter for general adult medical examination without abnormal findings: Secondary | ICD-10-CM | POA: Diagnosis not present

## 2020-09-20 DIAGNOSIS — Z113 Encounter for screening for infections with a predominantly sexual mode of transmission: Secondary | ICD-10-CM | POA: Diagnosis not present

## 2020-09-20 DIAGNOSIS — Z111 Encounter for screening for respiratory tuberculosis: Secondary | ICD-10-CM | POA: Diagnosis not present

## 2020-09-20 DIAGNOSIS — Z68.41 Body mass index (BMI) pediatric, 5th percentile to less than 85th percentile for age: Secondary | ICD-10-CM | POA: Diagnosis not present

## 2020-09-30 DIAGNOSIS — Z1152 Encounter for screening for COVID-19: Secondary | ICD-10-CM | POA: Diagnosis not present

## 2021-05-05 DIAGNOSIS — N760 Acute vaginitis: Secondary | ICD-10-CM | POA: Diagnosis not present

## 2021-05-05 DIAGNOSIS — Z113 Encounter for screening for infections with a predominantly sexual mode of transmission: Secondary | ICD-10-CM | POA: Diagnosis not present

## 2021-06-22 DIAGNOSIS — Z23 Encounter for immunization: Secondary | ICD-10-CM | POA: Diagnosis not present

## 2021-08-24 ENCOUNTER — Encounter (HOSPITAL_COMMUNITY): Payer: Self-pay | Admitting: Nurse Practitioner

## 2021-08-24 DIAGNOSIS — Z01419 Encounter for gynecological examination (general) (routine) without abnormal findings: Secondary | ICD-10-CM | POA: Diagnosis not present

## 2021-08-24 DIAGNOSIS — Z113 Encounter for screening for infections with a predominantly sexual mode of transmission: Secondary | ICD-10-CM | POA: Diagnosis not present

## 2021-08-24 DIAGNOSIS — Z6825 Body mass index (BMI) 25.0-25.9, adult: Secondary | ICD-10-CM | POA: Diagnosis not present

## 2021-08-25 DIAGNOSIS — Z3202 Encounter for pregnancy test, result negative: Secondary | ICD-10-CM | POA: Diagnosis not present

## 2021-08-25 DIAGNOSIS — Z30430 Encounter for insertion of intrauterine contraceptive device: Secondary | ICD-10-CM | POA: Diagnosis not present

## 2021-10-03 DIAGNOSIS — R55 Syncope and collapse: Secondary | ICD-10-CM | POA: Diagnosis not present

## 2021-10-03 DIAGNOSIS — W1830XA Fall on same level, unspecified, initial encounter: Secondary | ICD-10-CM | POA: Diagnosis not present

## 2021-10-03 DIAGNOSIS — Y999 Unspecified external cause status: Secondary | ICD-10-CM | POA: Diagnosis not present

## 2021-10-03 DIAGNOSIS — S0990XA Unspecified injury of head, initial encounter: Secondary | ICD-10-CM | POA: Diagnosis not present

## 2021-10-03 NOTE — ED Provider Notes (Addendum)
 History   Chief Complaint  Patient presents with  . Loss of Consciousness    History provided by:  The patient Illness Severity:  Mild Onset quality:  Gradual Timing:  Constant Progression:  Resolved Chronicity:  New    20 year old female who presents after she had an episode of passing out yesterday and today.  No seizure-like activity was noted. Patient recently finished a heavy menstrual cycle.  Has been having some body aches, headaches over the past week.  No fevers or chills associated with this.  No chest pain or palpitations with this.   History reviewed. No pertinent past medical history.  History reviewed. No pertinent surgical history.  No family history on file.  Social History   Tobacco Use  . Smoking status: Never Smoker  . Smokeless tobacco: Never Used  Substance Use Topics  . Alcohol use: Not Currently    Comment: rarelu  . Drug use: Yes    Types: Marijuana    Comment: occ     Review of Systems  All other systems reviewed and are negative.   Physical Exam   ED Triage Vitals [10/03/21 2109]  BP 116/75  MAP (mmHg)   Pulse 91  Resp 16  Temp 98.6 F (37 C)  SpO2 98 %  O2 Device None (Room air)  O2 Flow Rate (L/min)   Weight      Physical Exam Vitals and nursing note reviewed.  Constitutional:      General: She is not in acute distress.    Appearance: She is well-developed. She is not diaphoretic.  HENT:     Head: Normocephalic.     Right Ear: External ear normal.     Left Ear: External ear normal.     Nose: Nose normal.  Eyes:     Conjunctiva/sclera: Conjunctivae normal.     Pupils: Pupils are equal, round, and reactive to light.  Cardiovascular:     Rate and Rhythm: Regular rhythm.  Pulmonary:     Effort: Pulmonary effort is normal. No respiratory distress.     Breath sounds: Normal breath sounds. No wheezing.  Abdominal:     General: Bowel sounds are normal. There is no distension.     Palpations: Abdomen is soft.      Tenderness: There is no abdominal tenderness. There is no guarding or rebound.  Musculoskeletal:        General: Normal range of motion.     Cervical back: Normal range of motion and neck supple.  Skin:    Coloration: Skin is not pale.     Findings: No erythema.  Neurological:     Mental Status: She is alert and oriented to person, place, and time.     Coordination: Coordination normal.     Comments: Able to ambulate in room independently with normal gait. Able to do heel to toe without losing balance. Romberg negative. Finger to nose is normal on both sides. 5/5 UE and LE bilaterally.    Does get lightheaded when I stand her up.   Psychiatric:        Behavior: Behavior normal.        Thought Content: Thought content normal.       ED Course  Procedures       [facility] ER provider interpretation of Imaging / Radiology:  No signs of acute intracranial bleed ER provider interpretation of EKG: ECG shows sinus rhythm, heart rate 83, no tachydysrhythmia. ER provider interpretation of Labs:  Labs Reviewed  UA, MICROSCOPIC IF INDICATED BY DIPSTICK - Abnormal; Notable for the following components:      Result Value    Urine Leukocyte Esterase Trace (*)    All other components within normal limits  CBC WITH AUTO DIFFERENTIAL PANEL - Abnormal; Notable for the following components:   RBC 4.09 (*)    Hemoglobin 11.4 (*)    Hematocrit 34.4 (*)    All other components within normal limits  MANUAL DIFFERENTIAL - Abnormal; Notable for the following components:   Seg Absolute 1.7 (*)    Lymph Manual Absolute 7.6 (*)    Blast Absolute 0.1 (*)    All other components within normal limits  COMPREHENSIVE METABOLIC PANEL - Normal  PREGNANCY, URINE - Normal  CBC AND DIFFERENTIAL   Narrative:    The following orders were created for panel order CBC and Differential. Procedure                               Abnormality         Status                    ---------                                -----------         ------                    CBC and Differential[[id number]]         Abnormal            Final result              Manual Differential[[id number]]          Abnormal            Final result               Please view results for these tests on the individual orders.  HOLD FOR URINE CULTURE  SPECIMEN FOR PATH REVIEW (LAB USE ONLY)  POCT GLUCOSE    CT Head Wo Contrast  Final Result       1.  No acute intracranial abnormality.  2.  Scattered frothy secretions in bilateral ethmoid air cells, which can be seen with acute sinusitis in the correct clinical setting.      Key medications administered in the ER:  Medications  lactated ringers  bolus (0 mLs Intravenous Stopped 10/03/21 2250)    Medical Decision Making:   Patient does get lightheaded when I stand her up.  After IV fluid she feels markedly better.  Her EKG is without emergency findings.  CT head was done because of the headaches and 2 episodes of syncope, CT head reassuring.   Suspect component of her syncope was related to recently finishing her cycle and also poor p.o. intake as suspect she has some sort of viral syndrome with the body aches and chills she has been having over the past few days.  She has no acute neurological deficits.  Able to stand without issues.  Of note, on the differential blast was seen, this was sent off for pathology, pathology equivocal.  Certainly no signs of blast crisis clinically, further, patient's white count overall is okay.   I emphasized to family in detail that this requires further outpatient work-up and likely repeat labs to be  done in the outpatient setting within a week.  Patient is with her mom and both understand the importance of follow-up care with primary care.  Given phone numbers as well to establish care with primary care.  Clinical Impression:  1. Traumatic injury of head, initial encounter   2. Syncope, unspecified syncope type      ED Disposition    ED Disposition  Discharge   Condition  Stable   Comment  [name] discharge to home/self care.                    Electronically signed by: Lolita Manly, MD 10/06/21 1643    Electronically signed by: Lolita Manly, MD 10/06/21 1645

## 2021-10-05 DIAGNOSIS — F322 Major depressive disorder, single episode, severe without psychotic features: Secondary | ICD-10-CM | POA: Diagnosis not present

## 2021-10-05 DIAGNOSIS — R002 Palpitations: Secondary | ICD-10-CM | POA: Diagnosis not present

## 2021-10-05 DIAGNOSIS — R590 Localized enlarged lymph nodes: Secondary | ICD-10-CM | POA: Diagnosis not present

## 2021-10-05 DIAGNOSIS — N898 Other specified noninflammatory disorders of vagina: Secondary | ICD-10-CM | POA: Diagnosis not present

## 2021-10-05 DIAGNOSIS — R55 Syncope and collapse: Secondary | ICD-10-CM | POA: Diagnosis not present

## 2021-10-06 DIAGNOSIS — R55 Syncope and collapse: Secondary | ICD-10-CM | POA: Diagnosis not present

## 2021-10-07 DIAGNOSIS — R55 Syncope and collapse: Secondary | ICD-10-CM | POA: Diagnosis not present

## 2021-10-14 DIAGNOSIS — Z30431 Encounter for routine checking of intrauterine contraceptive device: Secondary | ICD-10-CM | POA: Diagnosis not present

## 2021-10-14 DIAGNOSIS — N760 Acute vaginitis: Secondary | ICD-10-CM | POA: Diagnosis not present

## 2021-11-01 DIAGNOSIS — R55 Syncope and collapse: Secondary | ICD-10-CM | POA: Diagnosis not present

## 2021-11-04 DIAGNOSIS — R55 Syncope and collapse: Secondary | ICD-10-CM | POA: Diagnosis not present

## 2022-02-03 DIAGNOSIS — Z30432 Encounter for removal of intrauterine contraceptive device: Secondary | ICD-10-CM | POA: Diagnosis not present

## 2022-02-03 DIAGNOSIS — N760 Acute vaginitis: Secondary | ICD-10-CM | POA: Diagnosis not present

## 2022-02-03 DIAGNOSIS — Z309 Encounter for contraceptive management, unspecified: Secondary | ICD-10-CM | POA: Diagnosis not present

## 2022-08-29 DIAGNOSIS — Z01419 Encounter for gynecological examination (general) (routine) without abnormal findings: Secondary | ICD-10-CM | POA: Diagnosis not present

## 2022-08-29 DIAGNOSIS — Z6826 Body mass index (BMI) 26.0-26.9, adult: Secondary | ICD-10-CM | POA: Diagnosis not present

## 2022-08-29 DIAGNOSIS — Z124 Encounter for screening for malignant neoplasm of cervix: Secondary | ICD-10-CM | POA: Diagnosis not present

## 2022-08-29 DIAGNOSIS — Z1151 Encounter for screening for human papillomavirus (HPV): Secondary | ICD-10-CM | POA: Diagnosis not present

## 2022-08-29 DIAGNOSIS — Z113 Encounter for screening for infections with a predominantly sexual mode of transmission: Secondary | ICD-10-CM | POA: Diagnosis not present

## 2022-09-04 DIAGNOSIS — F331 Major depressive disorder, recurrent, moderate: Secondary | ICD-10-CM | POA: Diagnosis not present

## 2022-09-14 DIAGNOSIS — F331 Major depressive disorder, recurrent, moderate: Secondary | ICD-10-CM | POA: Diagnosis not present

## 2022-09-18 DIAGNOSIS — F331 Major depressive disorder, recurrent, moderate: Secondary | ICD-10-CM | POA: Diagnosis not present

## 2022-10-16 DIAGNOSIS — F331 Major depressive disorder, recurrent, moderate: Secondary | ICD-10-CM | POA: Diagnosis not present

## 2024-03-26 ENCOUNTER — Other Ambulatory Visit: Payer: Self-pay

## 2024-03-26 ENCOUNTER — Encounter (HOSPITAL_COMMUNITY): Payer: Self-pay | Admitting: Emergency Medicine

## 2024-03-26 ENCOUNTER — Emergency Department (HOSPITAL_COMMUNITY)
Admission: EM | Admit: 2024-03-26 | Discharge: 2024-03-27 | Disposition: A | Discharge status: Other Acute Inpatient Hospital | Attending: Emergency Medicine | Admitting: Emergency Medicine

## 2024-03-26 DIAGNOSIS — Z91040 Latex allergy status: Secondary | ICD-10-CM | POA: Diagnosis not present

## 2024-03-26 DIAGNOSIS — T1491XA Suicide attempt, initial encounter: Secondary | ICD-10-CM

## 2024-03-26 DIAGNOSIS — F332 Major depressive disorder, recurrent severe without psychotic features: Secondary | ICD-10-CM | POA: Diagnosis present

## 2024-03-26 DIAGNOSIS — T50902A Poisoning by unspecified drugs, medicaments and biological substances, intentional self-harm, initial encounter: Secondary | ICD-10-CM | POA: Diagnosis not present

## 2024-03-26 DIAGNOSIS — T43592A Poisoning by other antipsychotics and neuroleptics, intentional self-harm, initial encounter: Secondary | ICD-10-CM | POA: Insufficient documentation

## 2024-03-26 HISTORY — DX: Major depressive disorder, single episode, unspecified: F32.9

## 2024-03-26 HISTORY — DX: Bipolar II disorder: F31.81

## 2024-03-26 LAB — COMPREHENSIVE METABOLIC PANEL WITH GFR
ALT: 13 U/L (ref 0–44)
AST: 17 U/L (ref 15–41)
Albumin: 3.7 g/dL (ref 3.5–5.0)
Alkaline Phosphatase: 51 U/L (ref 38–126)
Anion gap: 9 (ref 5–15)
BUN: 13 mg/dL (ref 6–20)
CO2: 22 mmol/L (ref 22–32)
Calcium: 9 mg/dL (ref 8.9–10.3)
Chloride: 107 mmol/L (ref 98–111)
Creatinine, Ser: 1.09 mg/dL — ABNORMAL HIGH (ref 0.44–1.00)
GFR, Estimated: >60 mL/min (ref >60)
Glucose, Bld: 111 mg/dL — ABNORMAL HIGH (ref 70–99)
Potassium: 4 mmol/L (ref 3.5–5.1)
Sodium: 138 mmol/L (ref 135–145)
Total Bilirubin: 0.6 mg/dL (ref 0.0–1.2)
Total Protein: 7 g/dL (ref 6.5–8.1)

## 2024-03-26 LAB — CBC
HCT: 35 % — ABNORMAL LOW (ref 36.0–46.0)
Hemoglobin: 11.4 g/dL — ABNORMAL LOW (ref 12.0–15.0)
MCH: 28.7 pg (ref 26.0–34.0)
MCHC: 32.6 g/dL (ref 30.0–36.0)
MCV: 88.2 fL (ref 80.0–100.0)
Platelets: 314 10*3/uL (ref 150–400)
RBC: 3.97 MIL/uL (ref 3.87–5.11)
RDW: 14.1 % (ref 11.5–15.5)
WBC: 8.1 10*3/uL (ref 4.0–10.5)
nRBC: 0 % (ref 0.0–0.2)

## 2024-03-26 LAB — RAPID URINE DRUG SCREEN, HOSP PERFORMED
Amphetamines: NOT DETECTED
Barbiturates: NOT DETECTED
Benzodiazepines: NOT DETECTED
Cocaine: NOT DETECTED
Opiates: NOT DETECTED
Tetrahydrocannabinol: NOT DETECTED

## 2024-03-26 LAB — ACETAMINOPHEN LEVEL
Acetaminophen (Tylenol), Serum: <10 ug/mL — ABNORMAL LOW (ref 10–30)
Acetaminophen (Tylenol), Serum: <10 ug/mL — ABNORMAL LOW (ref 10–30)

## 2024-03-26 LAB — ETHANOL: Alcohol, Ethyl (B): <15 mg/dL (ref <15)

## 2024-03-26 LAB — HCG, SERUM, QUALITATIVE: Preg, Serum: NEGATIVE

## 2024-03-26 MED ORDER — LAMOTRIGINE 25 MG PO TABS: 25.0000 mg | ORAL_TABLET | Freq: Every day | ORAL | Status: DC

## 2024-03-26 MED ORDER — FLUOXETINE HCL 20 MG PO CAPS: 20.0000 mg | ORAL_CAPSULE | Freq: Every day | ORAL | Status: DC

## 2024-03-26 NOTE — ED Notes (Signed)
 Case #[id number]

## 2024-03-26 NOTE — ED Notes (Signed)
 I have explained the process of having her change into scrubs and locking up her belongings and having her wanded.

## 2024-03-26 NOTE — ED Notes (Signed)
 IVC IS COMPLETE 3 COPIES ATTACHED TO CLIPBOARD IN PURPLE ZONE ONE IN MEDICAL RECORDS ORIGINAL IN RED FOLDER

## 2024-03-26 NOTE — ED Notes (Signed)
 EXP 5/7

## 2024-03-26 NOTE — ED Notes (Signed)
 Please give update to fiance Giana howered [phone]

## 2024-03-26 NOTE — Consult Note (Addendum)
 [facility] Psychiatric Consult Initial  Patient Name: .Krista Schmidt  MRN: [id number]  DOB: 01/08/2001  Consult Order details:  Orders (From admission, onward)     Start     Ordered   03/26/24 1343  CONSULT TO CALL ACT TEAM       Ordering Provider: Denese Finn, PA-C  Provider:  (Not yet assigned)  Question:  Reason for Consult?  Answer:  Psych consult   03/26/24 1342             Mode of Visit: In person    Psychiatry Consult Evaluation  Service Date: March 26, 2024 LOS:  LOS: 0 days  Chief Complaint: Suicide attempt by overdose  Primary Psychiatric Diagnoses  Intentional overdose 2.  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 AA female with a past psychiatric unipolar major depression and bipolar 2 disorder, and pertinent medical comorbidities/history that include none, who presented this encounter by way of self after attempting suicide by overdose on prescription medication.  Patient currently is not yet medically clear, as well as is IVC'd, per EDP team.  Upon evaluation, patient presents with symptomology this encounter that is most consistent with a unipolar major depressive disorder.  Evidence of this is appreciable from the recent suicide attempt by way of attempting to overdose on hydroxyzine , as well as depressive symptomology endorsed by the patient she has been experiencing, in the context of psychosocial stressors.  Recommendation is for inpatient mental health hospitalization, for safety and stabilization of the patient.  Discussed with patient restarting previous medications of lamotrigine and Prozac that have been helpful previously in maintaining stability for the patient, to which patient verbalized she was amenable to this.  Diagnoses:  Active Hospital problems: Principal Problem:   Intentional overdose (HCC) Active Problems:   Severe recurrent major depression without psychotic features (HCC)     Plan   # Intentional overdose #MDD recurrent episode severe without psychotic features  ## Psychiatric Medication Recommendations:   - Recommend restart Lamictal 25 mg p.o. daily -Recommend restart Prozac at 20 mg p.o. daily  ## Medical Decision Making Capacity: Not specifically addressed in this encounter  ## Further Work-up: EKG, UA, UDS  ## Disposition:--Recommend inpatient mental health hospitalization under involuntary commitment  ## Behavioral / Environmental: -Strict agitation/safety precautions    ## Safety and Observation Level:  - Based on my clinical evaluation, I estimate the patient to be at low risk of self harm in the current setting. - At this time, we recommend  1:1 Observation. This decision is based on my review of the chart including patient's history and current presentation, interview of the patient, mental status examination, and consideration of suicide risk including evaluating suicidal ideation, plan, intent, suicidal or self-harm behaviors, risk factors, and protective factors. This judgment is based on our ability to directly address suicide risk, implement suicide prevention strategies, and develop a safety plan while the patient is in the clinical setting. Please contact our team if there is a concern that risk level has changed.  CSSR Risk Category:C-SSRS RISK CATEGORY: No Risk  Suicide Risk Assessment: Patient has following modifiable risk factors for suicide: untreated depression and medication noncompliance, which we are addressing by treatment recommendations/evaluations. Patient has following non-modifiable or demographic risk factors for suicide: history of suicide attempt and psychiatric hospitalization Patient has the following protective factors against suicide: Access to outpatient mental health care, Supportive family, Supportive friends, and no history of NSSIB  Thank you for this consult request. Recommendations have been communicated to  the primary team.  We will continue to follow at this time.   Volanda Gruber, NP       History of Present Illness   Krista Schmidt is a 23 y.o. AA female with a past psychiatric unipolar major depression and bipolar 2 disorder, and pertinent medical comorbidities/history that include none, who presented this encounter by way of self after attempting suicide by overdose on prescription medication.  Patient currently is medically clear, as well as IVC, per EDP team.  Patient seen today at the [facility] emergency department for face-to-face psychiatric evaluation.  Upon evaluation, patient endorses that in the context of psychosocial stressors, earlier today she attempted to overdose on previously prescribed hydroxyzine , in an attempt to end her life.  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  Patient endorses that she has a long history of struggling with depression, states that this is not the first time she has attempted suicide, states that she has attempted suicide one other time several years ago also by attempted overdose; denies any history past or present of self-injurious behavior.  Patient endorses that due to chronic struggles with depression, has been seeing a provider through [facility] for quite a while, and actually up until a month ago was stable on a regimen of low-dose lamotrigine and moderate dose of Prozac, but stopped taking these medications, in hopes of being able to join [employer].  Patient endorses that she is not in any therapy, as well as has not seen her provider since  she stopped taking her medication a month ago, but does have a history of utilizing therapy services in the past for a few months, though unfortunately had to stop due to it being expensive.  Patient endorses that due to recent psychosocial stressors, has been experiencing severe troubles with concentration, increased lethargy/fatigue, excessive sleeping, reduced appetite, inappropriate feelings of worthlessness/guilt, suicidal thoughts, anhedonia, mild psychomotor slowing, and troubles with irritability and agitations.  Patient endorses additionally she has been having troubles with anxiety, reports difficulties with being able to relax, worrying about a variety of things, and having difficulties with feeling stressed; denies having any panic attacks past or present though notably.  Patient endorses no utilization past or present of drugs or EtOH, but endorses that a couple months ago she began smoking nicotine by way of utilizing a vape, as well as expands on this, and endorses that she continues to vape to present.  Patient orientation is intact, no concerns for fluctuations in consciousness.  Patient endorses that her current mood is depressed, which is congruent to a tearful and sad affect and interpersonal style.  Patient endorses she currently does not feel suicidal, expresses regret around her suicide attempt, describing it as impulsive.  Patient endorses no homicidal ideations, denies auditory and/or visual hallucinations, and objectively, does not appear to be presenting with psychotic features.  Discussed with patient that given the events that transpired, recommendation would be for inpatient mental health hospitalization, to which patient verbalized she was amenable to this,  as well as it was discussed restarting previous medications of Lamictal and Prozac that have been beneficial for her, to which she also agreed.  Review of Systems  Constitutional:  Positive for malaise/fatigue.   Psychiatric/Behavioral:  Positive for depression. Negative for hallucinations, substance abuse and suicidal ideas. The patient is nervous/anxious. The patient does not have insomnia.   All other systems reviewed and are negative.   Psychiatric and Social History  Psychiatric History:  Information collected from Chart review/patient   Prev Dx/Sx: Bipolar 2, MDD Current Psych Provider: None currently, but recently, [facility] Meds (current): None currently, but approximately 1 month ago, was on a stable regimen of low-dose Lamictal and moderate dose Prozac Previous Med Trials: Abilify, Lamictal, Prozac, hydroxyzine ; [facility] 2021-Effexor , Zoloft trials-> both worked for a meaningful period of time before not working eventually Therapy: Reports history of therapy for a few months around 2021 mental health hospitalization, but none recently  Prior Psych Hospitalization: [facility] 2021 Prior Self Harm: Yes, attempted to overdose around 2020 Prior Violence: None  Family Psych History: mother has history of depression and anxiety, no suicides in family, no alcohol abuse in family  Family Hx suicide: None reported  Social History:  Developmental Hx: WDL Educational Hx: Academic librarian Hx: Editor, commissioning Hx: IVC Living Situation: Lives with girlfriend Spiritual Hx: None reported Access to weapons/lethal means: None reported  Substance History Alcohol: None reported Type of alcohol : None reported Last Drink : None reported Number of drinks per day : None reported History of alcohol withdrawal seizures : None reported History of DT's : None reported Tobacco: Vapes daily Illicit drugs: None reported Prescription drug abuse: None reported Rehab hx: None reported  Exam Findings  Physical Exam: As below Vital Signs:  Temp:  [98.5 F (36.9 C)-99.8 F (37.7 C)] 98.5 F (36.9 C) (04/30 1543) Pulse Rate:  [89-91] 89 (04/30 1543) Resp:  [16-20] 20 (04/30 1543) BP: (104-114)/(63-77)  104/63 (04/30 1543) SpO2:  [99 %-100 %] 99 % (04/30 1543) Weight:  [84.4 kg] 84.4 kg (04/30 1238) Blood pressure 104/63, pulse 89, temperature 98.5 F (36.9 C), temperature source Oral, resp. rate 20, height 5\' 4"  (1.626 m), weight 84.4 kg, SpO2 99%. Body mass index is 31.93 kg/m.  Physical Exam Vitals and nursing note reviewed.  Constitutional:      General: She is not in acute distress.    Appearance: She is not ill-appearing, toxic-appearing or diaphoretic.     Comments: Sad and tearful interpersonal style   Pulmonary:     Effort: Pulmonary effort is normal.  Skin:    General: Skin is warm and dry.  Neurological:     Mental Status: She is alert and oriented to person, place, and time.     Motor: No weakness, tremor or seizure activity.  Psychiatric:        Attention and Perception: Attention and perception normal. She does not perceive auditory or visual hallucinations.        Mood and Affect: Mood is depressed. Affect is tearful.        Speech: Speech normal.        Behavior: Behavior is slowed and withdrawn. Behavior is not agitated, aggressive or hyperactive. Behavior is cooperative.        Thought Content: Thought content normal. Thought content is not paranoid or delusional. Thought content does not include homicidal or suicidal ideation.        Cognition and Memory: Cognition and memory normal.  Judgment: Judgment is impulsive.   Mental Status Exam: General Appearance:  In scrubs with sad and tearful interpersonal style  Orientation:  Full (Time, Place, and Person)  Memory:   WDL  Concentration:  Concentration: Fair and Attention Span: Fair  Recall:  Fair  Attention  Fair  Eye Contact:  Fair  Speech:  Clear and Coherent and Normal Rate  Language:  Fair  Volume:  Normal  Mood: Depressed  Affect:  Tearful  Thought Process:  Coherent and Goal Directed  Thought Content:  Negative and Logical  Suicidal Thoughts:  No  Homicidal Thoughts:  No  Judgement:  Impulsive  Insight:  Lacking, Present, and Shallow  Psychomotor Activity: Very mild of psychomotor slowing  Akathisia:  No  Fund of Knowledge:  Fair      Assets:  Manufacturing systems engineer Desire for Improvement Financial Resources/Insurance Housing Intimacy Leisure Time Physical Health Resilience Social Support Talents/Skills Transportation Vocational/Educational  Cognition:  WNL  ADL's:  Intact  AIMS (if indicated):   0     Other History   These have been pulled in through the EMR, reviewed, and updated if appropriate.  Family History:  The patient's family history includes Asthma in her maternal grandmother; Cancer in her maternal aunt and maternal grandfather; Depression in her mother; Diabetes in her paternal grandmother; Hypertension in her maternal uncle.  Medical History: Past Medical History:  Diagnosis Date  . Bipolar 2 disorder (HCC)   . MDD (major depressive disorder)     Surgical History: Past Surgical History:  Procedure Laterality Date  . WISDOM TOOTH EXTRACTION  07/2017     Medications:  No current facility-administered medications for this encounter.  Current Outpatient Medications:  .  hydrOXYzine  (ATARAX /VISTARIL ) 25 MG tablet, Take 1 tablet (25 mg total) by mouth daily. (Patient taking differently: Take 25 mg by mouth daily as needed for anxiety.), Disp: 30 tablet, Rfl: 1 .  ARIPiprazole (ABILIFY) 5 MG tablet, Take 5 mg by mouth daily. (Patient not taking: Reported on 03/26/2024), Disp: , Rfl:  .  lamoTRIgine (LAMICTAL) 25 MG tablet, Take 25 mg by mouth daily. (Patient not taking: Reported on 03/26/2024), Disp: , Rfl:  .  PROZAC 40 MG capsule, Take 1 capsule every day by oral route. (Patient not taking: Reported on 03/26/2024), Disp: , Rfl:   Allergies: Allergies  Allergen Reactions  . Banana Itching and Other (See Comments)    Throat and tongue itch   . Pollen Extract Shortness Of Breath, Itching and Cough    Close to anaphylaxis and this causes  wheezing  . Honey Bee Treatment [Bee Venom] Other (See Comments)    Headaches   . Shellfish-Derived Products Diarrhea, Itching and Other (See Comments)    Throat itches and gums "burn" also  . Amoxicillin Nausea And Vomiting  . Latex Rash    Volanda Gruber, NP

## 2024-03-26 NOTE — ED Provider Notes (Signed)
 [place] EMERGENCY DEPARTMENT AT [facility] Provider Note   CSN: [id number] Arrival date & time: 03/26/24  1207     History  No chief complaint on file.   Krista Schmidt is a 23 y.o. female history of MDD, bipolar 2, suicide attempts in the past presented after an overdose at noon.  Patient states she took an unknown amount of 20 mg hydroxyzine  pills in attempt to end her life as "life is not going away."  Patient states she no longer has thoughts of hurting herself and has never had thoughts of hurting others.  Patient states she has had these thoughts of hurting self the past 2 weeks.  Patient states she is currently a little tired but otherwise denies chest pain, shortness of breath, abdominal pain, tremors, paresthesias or new onset weakness.  Home Medications Prior to Admission medications   Medication Sig Start Date End Date Taking? Authorizing Provider  hydrOXYzine  (ATARAX /VISTARIL ) 25 MG tablet Take 1 tablet (25 mg total) by mouth daily. 03/31/20   Money, Christella Coventry, FNP  TRI-SPRINTEC 0.18/0.215/0.25 MG-35 MCG tablet Take 1 tablet by mouth daily.  11/25/18   [provider]  venlafaxine  XR (EFFEXOR -XR) 75 MG 24 hr capsule Take 1 capsule (75 mg total) by mouth daily with breakfast. 04/01/20   Money, Christella Coventry, FNP      Allergies    Banana, Pollen extract, Honey bee treatment [bee venom], Shellfish-derived products, Amoxicillin, and Latex    Review of Systems   Review of Systems  Physical Exam Updated Vital Signs BP 114/77   Pulse 91   Temp 99.8 F (37.7 C) (Oral)   Resp 16   Ht 5\' 4"  (1.626 m)   Wt 84.4 kg   SpO2 100%   BMI 31.93 kg/m  Physical Exam Vitals reviewed.  Constitutional:      General: She is not in acute distress.    Comments: Tearful on exam  HENT:     Head: Normocephalic and atraumatic.  Eyes:     Extraocular Movements: Extraocular movements intact.     Conjunctiva/sclera: Conjunctivae normal.     Pupils: Pupils are equal, round,  and reactive to light.  Cardiovascular:     Rate and Rhythm: Normal rate and regular rhythm.     Pulses: Normal pulses.     Heart sounds: Normal heart sounds.     Comments: 2+ bilateral radial/dorsalis pedis pulses with regular rate Pulmonary:     Effort: Pulmonary effort is normal. No respiratory distress.     Breath sounds: Normal breath sounds.  Abdominal:     Palpations: Abdomen is soft.     Tenderness: There is no abdominal tenderness. There is no guarding or rebound.  Musculoskeletal:        General: Normal range of motion.     Cervical back: Normal range of motion and neck supple.     Comments: 5 out of 5 bilateral grip/leg extension strength  Skin:    General: Skin is warm and dry.     Capillary Refill: Capillary refill takes less than 2 seconds.  Neurological:     General: No focal deficit present.     Mental Status: She is alert and oriented to person, place, and time.     Comments: Sensation intact in all 4 limbs  Psychiatric:     Comments: Does not endorse SI or HI with me     ED Results / Procedures / Treatments   Labs (all labs ordered are listed, but  only abnormal results are displayed) Labs Reviewed  COMPREHENSIVE METABOLIC PANEL WITH GFR  ETHANOL  CBC  RAPID URINE DRUG SCREEN, HOSP PERFORMED  HCG, SERUM, QUALITATIVE    EKG None  Radiology No results found.  Procedures Procedures    Medications Ordered in ED Medications - No data to display  ED Course/ Medical Decision Making/ A&P                                 Medical Decision Making Amount and/or Complexity of Data Reviewed Labs: ordered.   [name] 23 y.o. presented today for psych evaluation. Working DDx that I considered at this time includes, but not limited to, primary psychosis, substance-induced psychosis, mood disturbance, SI/HI, arrhythmias, electrolyte abnormalities, seizures.  R/o DDx: Pending  Review of prior external notes: 09/03/2023 office visit  Unique Tests  and My Independent Interpretation:  CBC: Unremarkable CMP: Unremarkable Serum pregnancy: Negative UDS: Pending Ethanol: Negative APAP: Negative EKG: Pending  Social Determinants of Health: none  Discussion with Independent Historian: None  Discussion of Management of Tests:  Concha Deed, poison control  Risk: High: hospitalization or escalation of hospital-level care  Risk Stratification Score: none  Plan: Patient presented after overdose on hydroxyzine  pills in attempt to end her life.  Patient states she has history of this in the past.  Patient currently states that she feels sleepy but otherwise has no concerns but that she no longer has thoughts of hurting herself or others.  Given patient's attempt to end her life IVC paperwork was completed.  I did speak to Concha Deed from poison control and she recommends getting a Tylenol  level at the 4-hour mark along with getting an EKG and if the QTc is longer than 500 optimize magnesium  and potassium.  After 8 hours of observation being asymptomatic patient would be medically cleared.  Will order these and once medically cleared will consult TTS for psych eval.  Patient signed out to [place], [place].  Please review their note for the continuation of patient's care.  The plan at this point is follow-up on labs and EKG and monitor until 8:00.  If patient is still asymptomatic at this point patient is medically cleared and can be evaluated by TTS.  This chart was dictated using voice recognition software.  Despite best efforts to proofread,  errors can occur which can change the documentation meaning.        Final Clinical Impression(s) / ED Diagnoses Final diagnoses:  None    Rx / DC Orders ED Discharge Orders     None         [name] 03/26/24 1509    Roberts Ching, MD 03/27/24 [phone]

## 2024-03-26 NOTE — ED Notes (Signed)
Unable to provide urine

## 2024-03-26 NOTE — ED Provider Notes (Signed)
   Accepted handoff at shift change from [facility]. Please see prior provider note for more detail.   Briefly: Patient is 23 y.o. "presented after an overdose at noon. Patient states she took an unknown amount of 20 mg hydroxyzine  pills in attempt to end her life as "life is not going away." Patient states she no longer has thoughts of hurting herself and has never had thoughts of hurting others. Patient states she has had these thoughts of hurting self the past 2 weeks. Patient states she is currently a little tired but otherwise denies chest pain, shortness of breath, abdominal pain, tremors, paresthesias or new onset weakness."  DDX: concern for primary psychosis, substance-induced psychosis, mood disturbance, SI/HI, arrhythmias, electrolyte abnormalities, seizures.   Plan:  - Patient IVC'd - repeat tylenol  level at 4PM. Patient can be medically cleared at 8PM. - EKG sinus rhythm - no QT prolongation. - repeat tylenol  level within normal limits.  Reevaluated patient at 8 PM.  Patient stating she feels good and does not have any symptoms currently.  Patient tolerating p.o.  Patient now medically cleared. - Psych recommending inpatient management.     [place], [place] 03/26/24 2022    Roberts Ching, MD 03/27/24 [phone]

## 2024-03-26 NOTE — Progress Notes (Signed)
 LCSW Progress Note:   Krista Schmidt  MRN: [id number]  03/26/2024 11:02 pm   Per Heyward Loud, NP, we Recommend inpatient mental health hospitalization under involuntary commitment. Per Kathryn Parish, RN no beds at [facility]. Advised to fax patient out. Patient faxed out to the following facilities consideration of bed placement.  Destination  Service Provider   Address Phone Fax Patient Preferred  [facility]   [address] [place] [address] [phone] --  [facility]   [address] [place], [place] [address] [phone] --  [facility]   [address] [place] [address] [phone] --  [facility]   [address] [place] [address] [phone] --  [facility]   [address], [place] [address] [phone] --  [facility]   [address] [place] [address] [phone] --  [facility]   [address] [place], [place] [phone] --  [facility]   [address] [place], [place] [address] [phone] --  [facility]   [address] [place] [address] [phone] --

## 2024-03-26 NOTE — ED Triage Notes (Signed)
 Pt came in POV for ingestion.  PT states she took an unknown number of 20 mg hydroxyzine  pills.  Pt states she was attempting self-harm.  She states she has had SI thoughts for about 2 weeks and this is the first time she had a plan and followed through in the last 2 weeks. Pt endorses disappointing life circumstances as the reason for her SI attempt.  She was trying to go into service but was not excepted.   She has hx of MDD and BIpolar 2 and has been hospitalized for [facility] concerns before.

## 2024-03-26 NOTE — ED Notes (Signed)
 IVC PAPERWORK HAS BEEN EFILED [facility] #[id number] WAITING ON OFFICER FOR FINDINGS AND CUSTODY ORDER

## 2024-03-27 NOTE — ED Notes (Signed)
IVC CURRENT

## 2024-03-27 NOTE — Progress Notes (Signed)
 Krista Schmidt  MRN: [id number]  03/26/2024 11:45 pm   CSW team received a call from Intake Coordinator [place]. Patient has been accepted for admission to [facility] on 03/27/2024 after 9:00 AM. Accepting provider: Dr. Dionisio Frederic Nurse report to be called in to [phone]. Patient's care team provided disposition updates.

## 2024-03-27 NOTE — ED Provider Notes (Signed)
 Emergency Medicine Observation Re-evaluation Note  Krista Schmidt is a 23 y.o. female, seen on rounds today.  Pt initially presented to the ED for complaints of suicidal ideation and overdose.  Physical Exam  BP 97/76 (BP Location: Right Arm)   Pulse 80   Temp 98.4 F (36.9 C) (Oral)   Resp 17   Ht 1.626 m (5\' 4" )   Wt 84.4 kg   SpO2 98%   BMI 31.93 kg/m  Physical Exam General: Resting Cardiac: Regular rate and rhythm Lungs: Normal effort   ED Course / MDM  EKG:EKG Interpretation Date/Time:  Wednesday March 26 2024 15:11:50 EDT Ventricular Rate:  78 PR Interval:  144 QRS Duration:  76 QT Interval:  388 QTC Calculation: 442 R Axis:   66  Text Interpretation: Normal sinus rhythm Normal ECG No old tracing to compare Confirmed by Albertus Hughs ([id number]) on 03/26/2024 3:24:53 PM  I have reviewed the labs performed to date as well as medications administered while in observation.  Recent changes in the last 24 hours include evaluation by psychiatry team.  Plan  Current plan is for inpatient treatment.  Patient has been medically cleared.    Trish Furl, MD 03/27/24 [phone]

## 2024-05-31 ENCOUNTER — Emergency Department (HOSPITAL_COMMUNITY)
Admission: EM | Admit: 2024-05-31 | Discharge: 2024-06-01 | Disposition: A | Discharge status: Home / Self Care | Attending: Student | Admitting: Student

## 2024-05-31 DIAGNOSIS — F410 Panic disorder [episodic paroxysmal anxiety] without agoraphobia: Secondary | ICD-10-CM | POA: Diagnosis not present

## 2024-05-31 DIAGNOSIS — R55 Syncope and collapse: Secondary | ICD-10-CM | POA: Diagnosis present

## 2024-05-31 DIAGNOSIS — Z91040 Latex allergy status: Secondary | ICD-10-CM | POA: Insufficient documentation

## 2024-06-01 ENCOUNTER — Other Ambulatory Visit: Payer: Self-pay

## 2024-06-01 ENCOUNTER — Encounter (HOSPITAL_COMMUNITY): Payer: Self-pay | Admitting: *Deleted

## 2024-06-01 DIAGNOSIS — R55 Syncope and collapse: Secondary | ICD-10-CM | POA: Diagnosis not present

## 2024-06-01 LAB — CBC WITH DIFFERENTIAL/PLATELET
Abs Immature Granulocytes: 0.03 K/uL (ref 0.00–0.07)
Basophils Absolute: 0 K/uL (ref 0.0–0.1)
Basophils Relative: 0 %
Eosinophils Absolute: 0.5 K/uL (ref 0.0–0.5)
Eosinophils Relative: 5 %
HCT: 33.3 % — ABNORMAL LOW (ref 36.0–46.0)
Hemoglobin: 11.2 g/dL — ABNORMAL LOW (ref 12.0–15.0)
Immature Granulocytes: 0 %
Lymphocytes Relative: 41 %
Lymphs Abs: 3.9 K/uL (ref 0.7–4.0)
MCH: 29.3 pg (ref 26.0–34.0)
MCHC: 33.6 g/dL (ref 30.0–36.0)
MCV: 87.2 fL (ref 80.0–100.0)
Monocytes Absolute: 0.9 K/uL (ref 0.1–1.0)
Monocytes Relative: 10 %
Neutro Abs: 4 K/uL (ref 1.7–7.7)
Neutrophils Relative %: 44 %
Platelets: 296 K/uL (ref 150–400)
RBC: 3.82 MIL/uL — ABNORMAL LOW (ref 3.87–5.11)
RDW: 14.1 % (ref 11.5–15.5)
WBC: 9.3 K/uL (ref 4.0–10.5)
nRBC: 0 % (ref 0.0–0.2)

## 2024-06-01 LAB — COMPREHENSIVE METABOLIC PANEL WITH GFR
ALT: 13 U/L (ref 0–44)
AST: 16 U/L (ref 15–41)
Albumin: 3.7 g/dL (ref 3.5–5.0)
Alkaline Phosphatase: 50 U/L (ref 38–126)
Anion gap: 8 (ref 5–15)
BUN: 20 mg/dL (ref 6–20)
CO2: 22 mmol/L (ref 22–32)
Calcium: 8.7 mg/dL — ABNORMAL LOW (ref 8.9–10.3)
Chloride: 107 mmol/L (ref 98–111)
Creatinine, Ser: 1.07 mg/dL — ABNORMAL HIGH (ref 0.44–1.00)
GFR, Estimated: >60 mL/min (ref >60)
Glucose, Bld: 95 mg/dL (ref 70–99)
Potassium: 3.8 mmol/L (ref 3.5–5.1)
Sodium: 137 mmol/L (ref 135–145)
Total Bilirubin: 0.5 mg/dL (ref 0.0–1.2)
Total Protein: 6.7 g/dL (ref 6.5–8.1)

## 2024-06-01 NOTE — ED Triage Notes (Signed)
 Approx 40 minutes ago at home the   pt was having a panic attack and she suddenly felt herself trembling all over  she reports that she was hyperventilating and felt herself   mvoing around raoidly  no tongue damage or incontinence of bowel or bladder  lmp noe

## 2024-06-01 NOTE — ED Notes (Signed)
Pt discharged. Pt given discharge papers and papers explained. Pt in NAD at this time
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
## 2024-09-09 ENCOUNTER — Other Ambulatory Visit: Payer: Self-pay | Admitting: Obstetrics and Gynecology

## 2024-09-09 ENCOUNTER — Encounter: Payer: Self-pay | Admitting: Obstetrics and Gynecology

## 2024-09-09 DIAGNOSIS — N6311 Unspecified lump in the right breast, upper outer quadrant: Secondary | ICD-10-CM

## 2024-09-18 ENCOUNTER — Ambulatory Visit
Admission: RE | Admit: 2024-09-18 | Discharge: 2024-09-18 | Disposition: A | Discharge status: Home / Self Care | Source: Ambulatory Visit | Attending: Obstetrics and Gynecology | Admitting: Obstetrics and Gynecology

## 2024-09-18 DIAGNOSIS — N6311 Unspecified lump in the right breast, upper outer quadrant: Secondary | ICD-10-CM
